# Patient Record
Sex: Female | Born: 1995 | Race: White | Hispanic: No | Marital: Single | Smoking: Never smoker
Health system: Southern US, Community
[De-identification: ages and names within clinical notes are randomized; demographics above are authoritative.]

## PROBLEM LIST (undated history)

## (undated) DIAGNOSIS — F419 Anxiety disorder, unspecified: Secondary | ICD-10-CM

## (undated) HISTORY — PX: FRACTURE SURGERY: SHX138

---
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
## 2017-01-03 ENCOUNTER — Emergency Department (HOSPITAL_BASED_OUTPATIENT_CLINIC_OR_DEPARTMENT_OTHER): Payer: 59

## 2017-01-03 ENCOUNTER — Emergency Department (HOSPITAL_BASED_OUTPATIENT_CLINIC_OR_DEPARTMENT_OTHER)
Admission: EM | Admit: 2017-01-03 | Discharge: 2017-01-03 | Disposition: A | Discharge status: Home / Self Care | Payer: 59 | Attending: Emergency Medicine | Admitting: Emergency Medicine

## 2017-01-03 ENCOUNTER — Encounter: Payer: Self-pay | Admitting: Emergency Medicine

## 2017-01-03 DIAGNOSIS — Y929 Unspecified place or not applicable: Secondary | ICD-10-CM | POA: Diagnosis not present

## 2017-01-03 DIAGNOSIS — S80922A Unspecified superficial injury of left lower leg, initial encounter: Secondary | ICD-10-CM | POA: Diagnosis present

## 2017-01-03 DIAGNOSIS — S8002XA Contusion of left knee, initial encounter: Secondary | ICD-10-CM | POA: Insufficient documentation

## 2017-01-03 DIAGNOSIS — Y939 Activity, unspecified: Secondary | ICD-10-CM | POA: Insufficient documentation

## 2017-01-03 DIAGNOSIS — Y999 Unspecified external cause status: Secondary | ICD-10-CM | POA: Diagnosis not present

## 2017-01-03 HISTORY — DX: Anxiety disorder, unspecified: F41.9

## 2017-01-03 MED ORDER — IBUPROFEN 400 MG PO TABS
400.0000 mg | ORAL_TABLET | Freq: Once | ORAL | Status: AC
  Administered 2017-01-03: 400 mg via ORAL
  Filled 2017-01-03: qty 1
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
## 2017-01-03 NOTE — ED Notes (Signed)
Pt ambulated to XR.

## 2017-01-03 NOTE — ED Notes (Signed)
ED Provider at bedside. 

## 2017-01-08 ENCOUNTER — Ambulatory Visit (INDEPENDENT_AMBULATORY_CARE_PROVIDER_SITE_OTHER): Payer: 59 | Admitting: Family Medicine

## 2017-01-08 ENCOUNTER — Encounter: Payer: Self-pay | Admitting: Family Medicine

## 2017-01-08 DIAGNOSIS — S8992XA Unspecified injury of left lower leg, initial encounter: Secondary | ICD-10-CM

## 2017-01-08 NOTE — Patient Instructions (Signed)
Your exam and ultrasound are reassuring. You have a severe knee contusion with the abrasions. Icing 15 minutes at a time 3-4 times a day. Ibuprofen 600mg  three times a day with food OR aleve 2 tabs twice a day with food for pain and inflammation. Ok to take tylenol in addition to this if needed. Elevation as needed for swelling. Can expect this to take 4-6 weeks to improve. Walking and activities as tolerated. Call me on Friday morning if you're not improved enough that you think you can get through clinicals without restrictions.

## 2017-01-11 ENCOUNTER — Ambulatory Visit: Payer: 59 | Admitting: Family Medicine

## 2017-01-13 DIAGNOSIS — S8992XA Unspecified injury of left lower leg, initial encounter: Secondary | ICD-10-CM | POA: Insufficient documentation
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
## 2017-01-13 NOTE — Assessment & Plan Note (Signed)
2/2 severe knee contusion, abrasions.  Knee exam reassuring otherwise.  Independently reviewed radiographs and no evidence fracture.  Brief MSK u/s confirms no effusion, tendons intact.  Icing, ibuprofen or aleve.  Elevation.  Activities as tolerated.  F/u in 4-6 weeks if not improving as expected but call with any questions.
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
## 2019-09-28 ENCOUNTER — Ambulatory Visit: Payer: Self-pay | Attending: Internal Medicine

## 2019-09-28 DIAGNOSIS — Z23 Encounter for immunization: Secondary | ICD-10-CM

## 2019-09-28 NOTE — Progress Notes (Signed)
   Covid-19 Vaccination Clinic  Name:  Tiffany Cooke    MRN: [id number] DOB: 1996-01-23  09/28/2019  [name] was observed post Covid-19 immunization for 15 minutes without incident. She was provided with Vaccine Information Sheet and instruction to access the V-Safe system.   [name] was instructed to call 911 with any severe reactions post vaccine: Marland Kitchen Difficulty breathing  . Swelling of face and throat  . A fast heartbeat  . A bad rash all over body  . Dizziness and weakness   Immunizations Administered    Name Date Dose VIS Date Route   Pfizer COVID-19 Vaccine 09/28/2019  8:23 AM 0.3 mL 06/02/2019 Intramuscular   Manufacturer: [employer], [employer]   Lot: [id number]   NDC: [address]-1

## 2019-10-23 ENCOUNTER — Ambulatory Visit: Payer: Self-pay | Attending: Internal Medicine

## 2019-10-23 DIAGNOSIS — Z23 Encounter for immunization: Secondary | ICD-10-CM

## 2019-10-23 NOTE — Progress Notes (Signed)
   Covid-19 Vaccination Clinic  Name:  Tiffany Cooke    MRN: [id number] DOB: 1995/09/18  10/23/2019  [name] was observed post Covid-19 immunization for 15 minutes without incident. She was provided with Vaccine Information Sheet and instruction to access the V-Safe system.   [name] was instructed to call 911 with any severe reactions post vaccine: Marland Kitchen Difficulty breathing  . Swelling of face and throat  . A fast heartbeat  . A bad rash all over body  . Dizziness and weakness   Immunizations Administered    Name Date Dose VIS Date Route   Pfizer COVID-19 Vaccine 10/23/2019  8:09 AM 0.3 mL 08/16/2018 Intramuscular   Manufacturer: [employer], [employer]   Lot: [id number]   NDC: [address]-1

## 2024-03-30 ENCOUNTER — Inpatient Hospital Stay (HOSPITAL_COMMUNITY)
Admission: AD | Admit: 2024-03-30 | Discharge: 2024-03-30 | Disposition: A | Discharge status: Home / Self Care | Payer: PRIVATE HEALTH INSURANCE | Attending: Family Medicine | Admitting: Family Medicine

## 2024-03-30 ENCOUNTER — Inpatient Hospital Stay (HOSPITAL_COMMUNITY): Payer: PRIVATE HEALTH INSURANCE

## 2024-03-30 ENCOUNTER — Other Ambulatory Visit: Payer: Self-pay

## 2024-03-30 DIAGNOSIS — Z3A01 Less than 8 weeks gestation of pregnancy: Secondary | ICD-10-CM

## 2024-03-30 DIAGNOSIS — O4690 Antepartum hemorrhage, unspecified, unspecified trimester: Secondary | ICD-10-CM

## 2024-03-30 DIAGNOSIS — Z3201 Encounter for pregnancy test, result positive: Secondary | ICD-10-CM | POA: Diagnosis not present

## 2024-03-30 DIAGNOSIS — R109 Unspecified abdominal pain: Secondary | ICD-10-CM | POA: Diagnosis present

## 2024-03-30 DIAGNOSIS — Z113 Encounter for screening for infections with a predominantly sexual mode of transmission: Secondary | ICD-10-CM | POA: Diagnosis present

## 2024-03-30 DIAGNOSIS — O209 Hemorrhage in early pregnancy, unspecified: Secondary | ICD-10-CM | POA: Insufficient documentation

## 2024-03-30 DIAGNOSIS — O3680X0 Pregnancy with inconclusive fetal viability, not applicable or unspecified: Secondary | ICD-10-CM | POA: Diagnosis not present

## 2024-03-30 LAB — POCT PREGNANCY, URINE: Preg Test, Ur: NEGATIVE

## 2024-03-30 LAB — CBC
HCT: 39.8 % (ref 36.0–46.0)
Hemoglobin: 13.6 g/dL (ref 12.0–15.0)
MCH: 31.6 pg (ref 26.0–34.0)
MCHC: 34.2 g/dL (ref 30.0–36.0)
MCV: 92.6 fL (ref 80.0–100.0)
Platelets: 207 K/uL (ref 150–400)
RBC: 4.3 MIL/uL (ref 3.87–5.11)
RDW: 11.8 % (ref 11.5–15.5)
WBC: 8.1 K/uL (ref 4.0–10.5)
nRBC: 0 % (ref 0.0–0.2)

## 2024-03-30 LAB — WET PREP, GENITAL
Clue Cells Wet Prep HPF POC: NONE SEEN
Sperm: NONE SEEN
Trich, Wet Prep: NONE SEEN
WBC, Wet Prep HPF POC: <10 (ref <10)
Yeast Wet Prep HPF POC: NONE SEEN

## 2024-03-30 LAB — HCG, QUANTITATIVE, PREGNANCY: hCG, Beta Chain, Quant, S: 39 m[IU]/mL — ABNORMAL HIGH (ref <5)

## 2024-03-30 NOTE — Discharge Instructions (Signed)
 It was a pleasure taking care of you today.  I am sorry you are having this bleeding.  We did an ultrasound that did not show any intrauterine gestational sac.  You need a repeat hCG in 48 hours so please return to the MAU for this lab work.  If you have any worsening pain or issues please return for further evaluation.  I hope you have a good rest of your day.

## 2024-03-30 NOTE — MAU Note (Signed)
 Tiffany Cooke is a 28 y.o. at Unknown here in MAU reporting: she's 5w 6d pregnant and awoke to having some small amount brown/red VB and cramping.  States hasn't been seen at [facility] office has appt later this month, called and instructed to be seen in MAU.  LMP: 02/18/2024 Onset of complaint: today Pain score: 2 Vitals:   03/30/24 1036  BP: 113/65  Pulse: 73  Resp: 20  Temp: 98.6 F (37 C)  SpO2: 99%     FHT: NA  Lab orders placed from triage: UPT

## 2024-03-30 NOTE — MAU Provider Note (Signed)
 History     CSN: [id number]  Arrival date and time: 03/30/24 0948   None     Chief Complaint  Patient presents with   Abdominal Pain   Vaginal Bleeding   HPI Patient is a 28 year old G2, P0 presenting for vaginal bleeding in early pregnancy.  Positive home pregnancy test on 9/29.  She has taken multiple home pregnancy test since then and they were positive.  She had a negative home pregnancy test approximately 4 days before the first positive test.  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  Patient presents today because she woke up this morning and noticed a small amount of brown/red blood and cramping.  No other issues.  OB History   No obstetric history on file.     Past Medical History:  Diagnosis Date   Anxiety     Past Surgical History:  Procedure Laterality Date   FRACTURE SURGERY      No family history on file.  Social History   Tobacco Use   Smoking status: Never   Smokeless tobacco: Never  Substance Use Topics   Alcohol use: No    Allergies:  Allergies  Allergen Reactions   Sulfa Antibiotics     No medications prior to admission.    Review of Systems  Gastrointestinal:  Positive for abdominal pain (Cramping).  Genitourinary:  Positive for vaginal bleeding. Negative for vaginal discharge.  All other systems reviewed and are negative.  Physical Exam   Blood pressure 113/65, pulse 73, temperature 98.6 F (37 C), temperature source Oral, resp. rate 20, height 5' 3 (1.6 m), weight 67.1 kg, last menstrual period 02/18/2024, SpO2 99%.  Physical Exam Vitals and nursing note reviewed.  Constitutional:      Appearance: Normal appearance.  HENT:     Head: Normocephalic and atraumatic.     Nose: No congestion or rhinorrhea.  Eyes:     Extraocular Movements: Extraocular movements intact.  Cardiovascular:     Rate and Rhythm: Normal rate.  Pulmonary:     Effort: Pulmonary effort is normal.  Abdominal:     Palpations: Abdomen is soft.      Tenderness: There is abdominal tenderness (Mild suprapubic).  Musculoskeletal:        General: Normal range of motion.     Cervical back: Normal range of motion.  Skin:    General: Skin is warm.     Capillary Refill: Capillary refill takes less than 2 seconds.  Neurological:     General: No focal deficit present.     Mental Status: She is alert.     Cranial Nerves: No cranial nerve deficit.  Psychiatric:        Mood and Affect: Mood normal.        Behavior: Behavior normal.     MAU Course  Procedures  MDM CBC ABO Ultrasound Beta-hCG quant UPT   Assessment and Plan  Tiffany Cooke is a 28 year old G2, P0 presenting for vaginal bleeding in early pregnancy.  Pregnancy of unknown location Vaginal bleeding in early pregnancy Patient presenting for vaginal bleeding which started this morning.  Home UPT is positive since 9/30.  UPT in the MAU negative.  hCG quant 39.  CBC within normal limits.  Wet prep within normal limits.  Ultrasound showing no gestational sac.  Differential including ectopic pregnancy versus missed AB.  Discussed strict ectopic precautions.  Patient needs repeat hCG in 48 hours and pending those results may need repeat ultrasound in 14 days.  Discussed  possibility of ectopic pregnancy versus early gestation with the patient and patient understanding.  She will return in 48 hours or sooner if needed.  No further questions or concerns.  Patient discharged home.  Tiffany Cooke 03/30/2024, 2:18 PM

## 2024-03-31 LAB — ABO/RH: ABO/RH(D): O POS

## 2024-03-31 LAB — GC/CHLAMYDIA PROBE AMP (~~LOC~~) NOT AT ARMC
Chlamydia: NEGATIVE
Comment: NEGATIVE
Comment: NORMAL
Neisseria Gonorrhea: NEGATIVE

## 2024-04-01 ENCOUNTER — Other Ambulatory Visit: Payer: Self-pay

## 2024-04-01 ENCOUNTER — Other Ambulatory Visit (HOSPITAL_COMMUNITY): Payer: PRIVATE HEALTH INSURANCE | Attending: Family Medicine

## 2024-04-01 ENCOUNTER — Ambulatory Visit: Payer: Self-pay

## 2024-04-01 ENCOUNTER — Inpatient Hospital Stay (HOSPITAL_COMMUNITY)
Admission: AD | Admit: 2024-04-01 | Discharge: 2024-04-01 | Disposition: A | Discharge status: Home / Self Care | Payer: PRIVATE HEALTH INSURANCE | Attending: Obstetrics and Gynecology | Admitting: Obstetrics and Gynecology

## 2024-04-01 DIAGNOSIS — O039 Complete or unspecified spontaneous abortion without complication: Secondary | ICD-10-CM | POA: Diagnosis present

## 2024-04-01 DIAGNOSIS — Z09 Encounter for follow-up examination after completed treatment for conditions other than malignant neoplasm: Secondary | ICD-10-CM | POA: Diagnosis not present

## 2024-04-01 LAB — HCG, QUANTITATIVE, PREGNANCY: hCG, Beta Chain, Quant, S: 19 m[IU]/mL — ABNORMAL HIGH (ref <5)

## 2024-04-01 NOTE — MAU Note (Signed)
 Tiffany Cooke is a 28 y.o. at Unknown here in MAU reporting: light to minimal spotting today  States the cramping is gone completely. Had a large clot 2 days ago and feels like she has passed the pregnancy.   Pain score: 0 Vitals:   04/01/24 1131  BP: 117/79  Pulse: 82  Resp: 18  Temp: 98.7 F (37.1 C)  SpO2: 99%     FHT:n/a Lab orders placed from triage:  bhcg

## 2024-04-01 NOTE — MAU Provider Note (Signed)
 History   Chief Complaint:  Follow-up   Tiffany Cooke is  28 y.o. No obstetric history on file. Patient's last menstrual period was 02/18/2024.SABRA Patient is here for follow up of quantitative HCG and ongoing surveillance of pregnancy status. She is Unknown weeks gestation  by LMP.    Since her last visit, the patient is without new complaint. The patient reports bleeding as  none now.  She denies any pain.  General ROS:  negative  Her previous Quantitative HCG values are:  10/09: 39  Physical Exam   Blood pressure 117/79, pulse 82, temperature 98.7 F (37.1 C), temperature source Oral, resp. rate 18, height 5' 3 (1.6 m), weight 64.9 kg, last menstrual period 02/18/2024, SpO2 99%.   Physical Exam Vitals and nursing note reviewed.  Constitutional:      General: She is not in acute distress.    Appearance: She is well-developed.  HENT:     Head: Normocephalic.  Eyes:     Pupils: Pupils are equal, round, and reactive to light.  Cardiovascular:     Rate and Rhythm: Normal rate and regular rhythm.  Pulmonary:     Effort: Pulmonary effort is normal. No respiratory distress.     Breath sounds: Normal breath sounds.  Abdominal:     Palpations: Abdomen is soft.     Tenderness: There is no abdominal tenderness.  Musculoskeletal:        General: Normal range of motion.     Cervical back: Normal range of motion.  Skin:    General: Skin is warm and dry.  Neurological:     Mental Status: She is alert and oriented to person, place, and time.  Psychiatric:        Behavior: Behavior normal.        Thought Content: Thought content normal.        Judgment: Judgment normal.       Labs: Results for orders placed or performed during the hospital encounter of 04/01/24 (from the past 24 hours)  hCG, quantitative, pregnancy   Collection Time: 04/01/24 11:28 AM  Result Value Ref Range   hCG, Beta Chain, Quant, S 19 (H) <5 mIU/mL    Assessment:   1. Miscarriage   2. Follow-up  exam    HCG dropped, consistent with SAB. Condolences provided and discussed plan of care. Will return for 1 week HCG in office and 2 weeks with a provider.   Plan: -Discharge home in stable condition -Vaginal bleeding and pain precautions discussed -Patient advised to follow-up with OB, message sent.  -Patient may return to MAU as needed or if her condition were to change or worsen  Aleck CHRISTELLA Fireman, CNM 04/01/2024, 11:34 AM

## 2024-04-03 ENCOUNTER — Other Ambulatory Visit: Payer: Self-pay

## 2024-04-03 ENCOUNTER — Telehealth: Payer: Self-pay | Admitting: Family Medicine

## 2024-04-03 DIAGNOSIS — O039 Complete or unspecified spontaneous abortion without complication: Secondary | ICD-10-CM

## 2024-04-03 NOTE — Telephone Encounter (Signed)
 Called patient to get her scheduled for labs this week and a SAB follow up in 2 weeks but she did not answer the call. I left a detailed message with our call back number asking to give us  a call back so that we can add her to our schedule.

## 2024-04-03 NOTE — Telephone Encounter (Signed)
 Patient called back to schedule f/u appts. Patient agreeable on day and time for lab visit. Patient declines making f/u with provder she says she would prefer to follow up with her ob which is [facility].

## 2024-04-07 ENCOUNTER — Other Ambulatory Visit: Payer: PRIVATE HEALTH INSURANCE
